# Patient Record
Sex: Female | Born: 1990 | Race: White | Hispanic: No
Health system: Southern US, Community
[De-identification: ages and names within clinical notes are randomized; demographics above are authoritative.]

---

## 2014-12-16 ENCOUNTER — Emergency Department: Payer: Self-pay | Admitting: Emergency Medicine

## 2015-10-04 IMAGING — CR DG SHOULDER 3+V*R*
1 series · 3 of 3 positions shown · non-contrast
Comparison: None.

CLINICAL DATA: A metal object fell from ceiling and hit patient in
right shoulder. Decreased range of motion, with right superior
shoulder pain. Initial encounter.

EXAM:
DG SHOULDER 3+ VIEWS RIGHT

[Series 1: dxr shoulder right complete · 0.14mm/px · 3 of 3 slices shown]
[im 1/3]
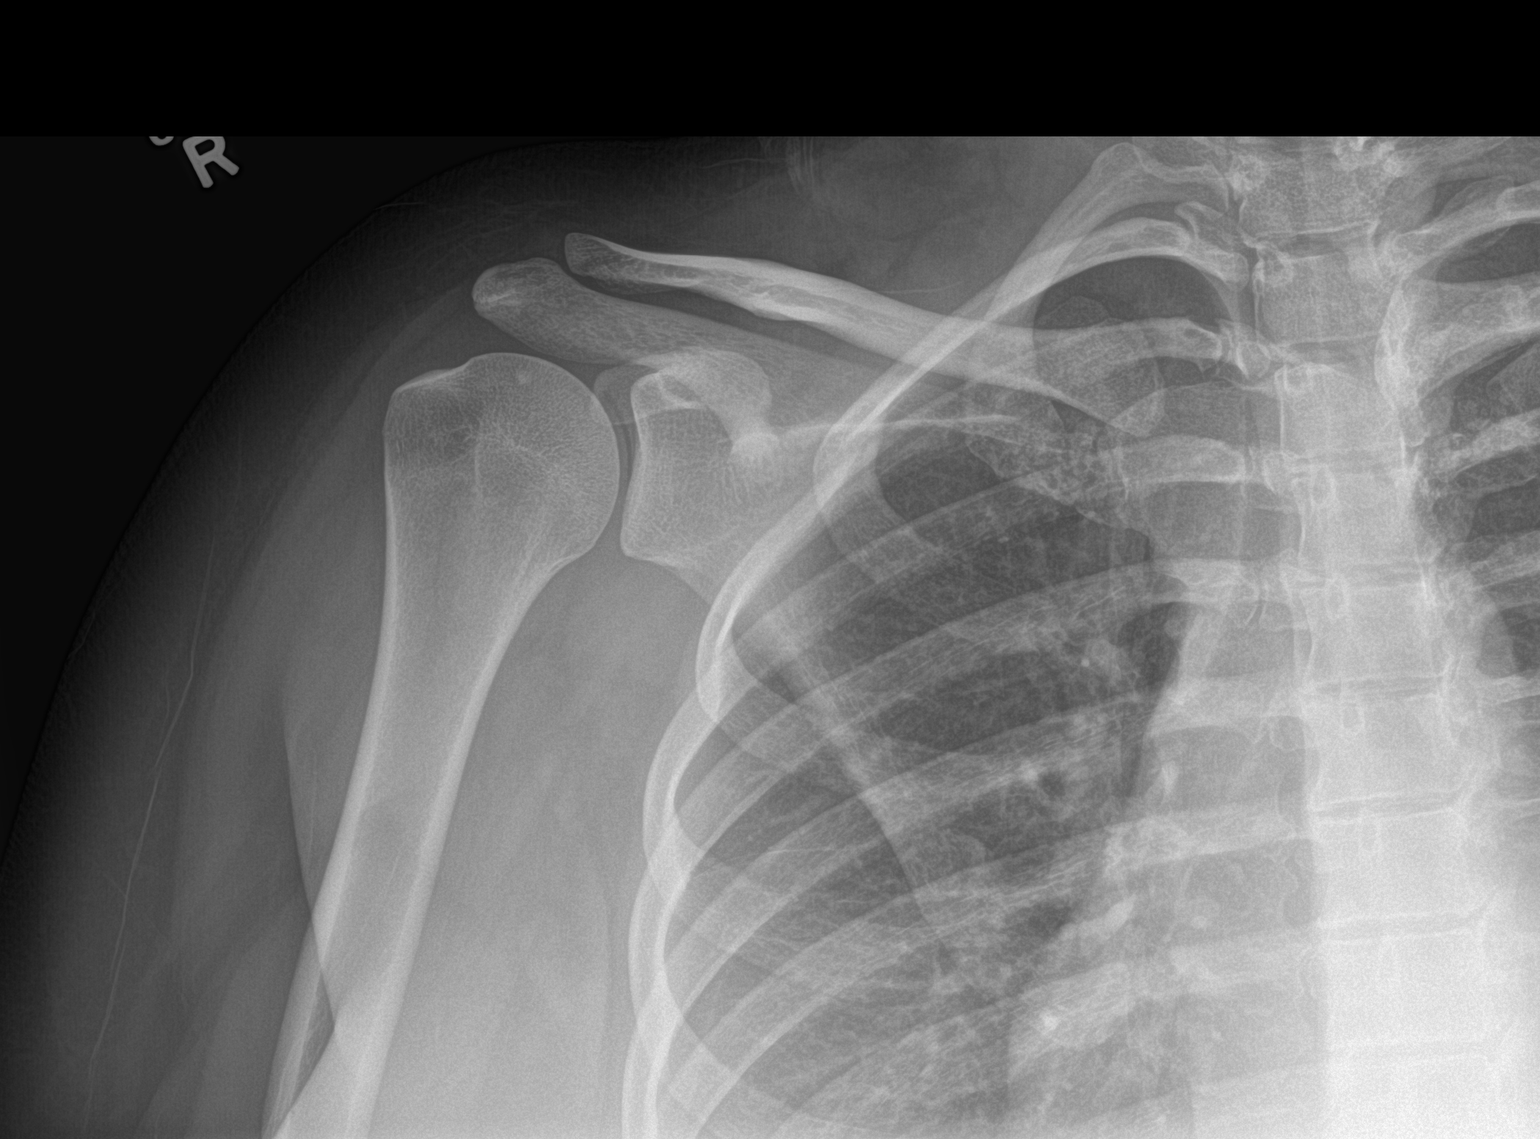
[im 2/3]
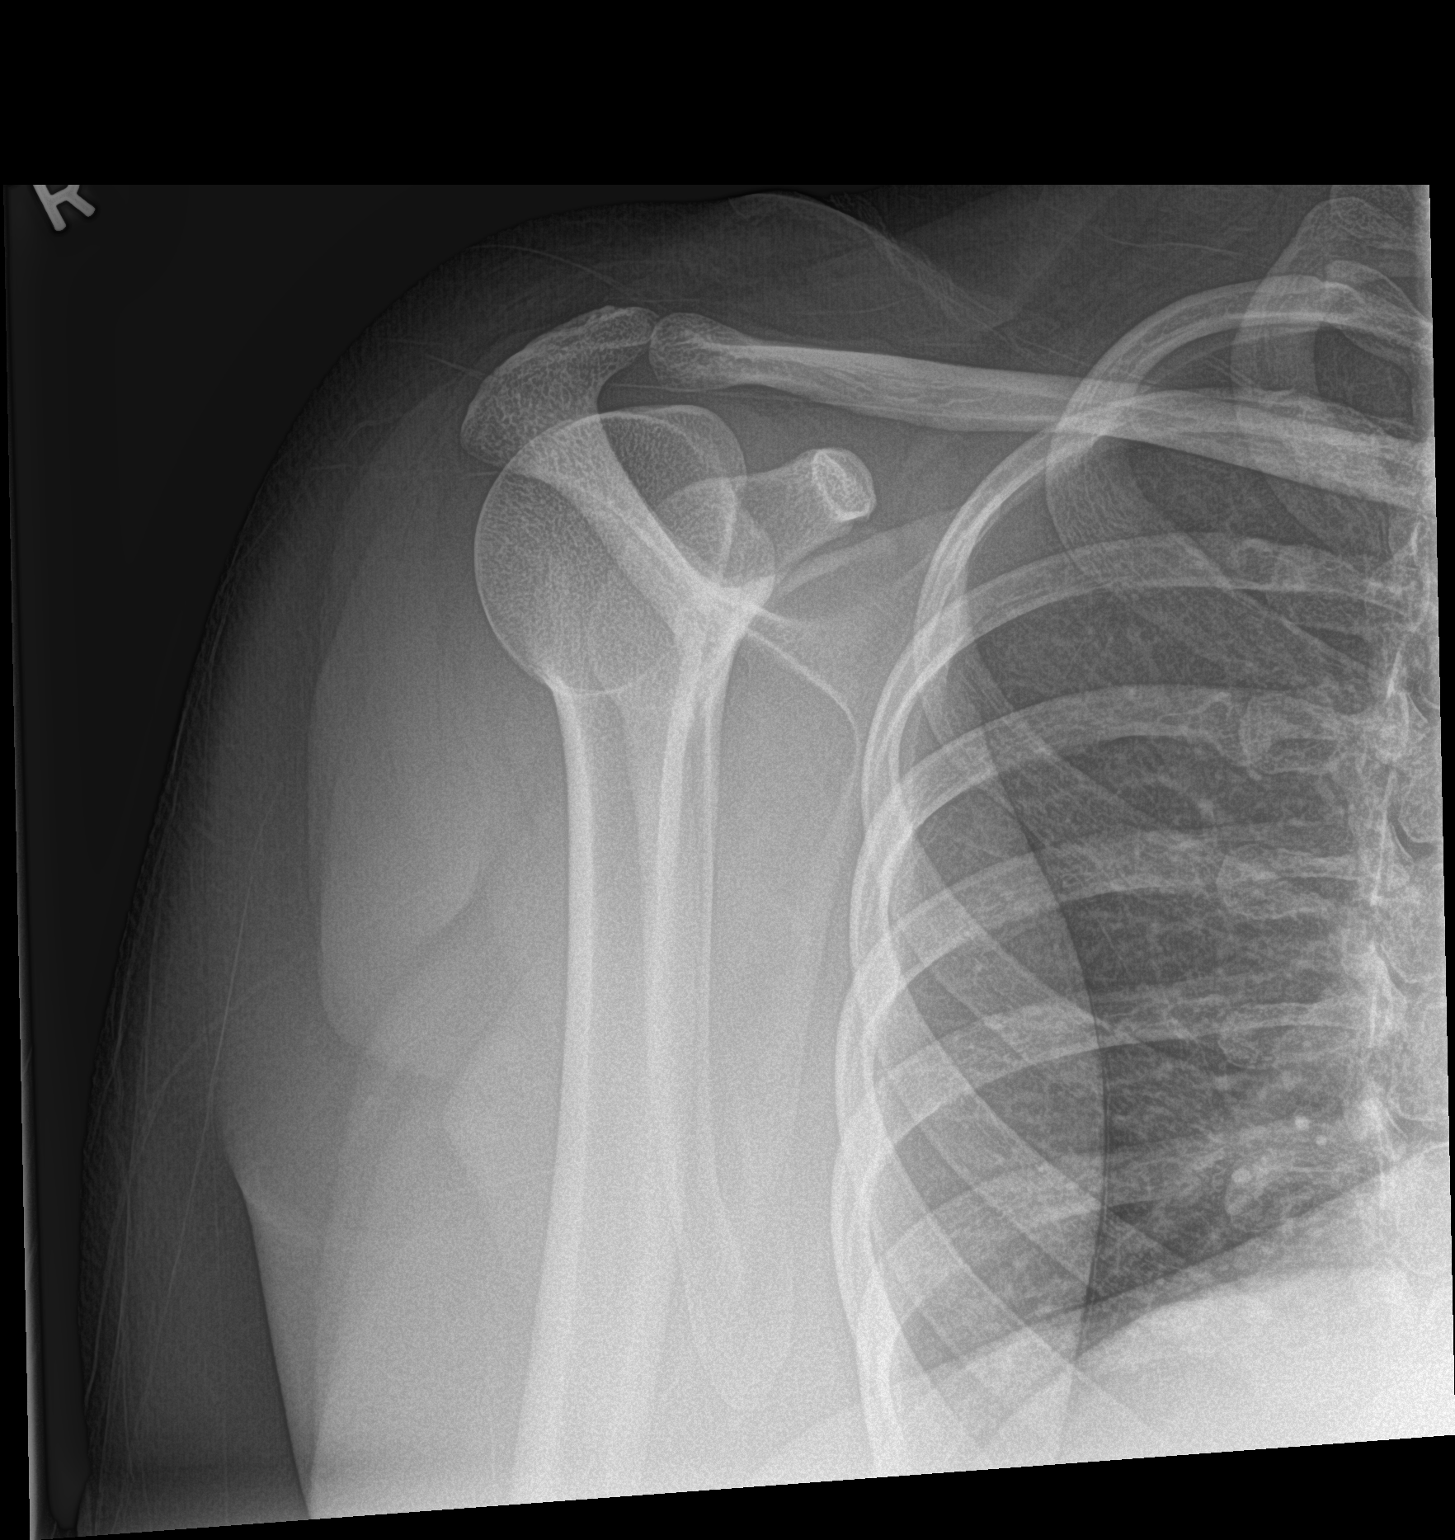
[im 3/3]
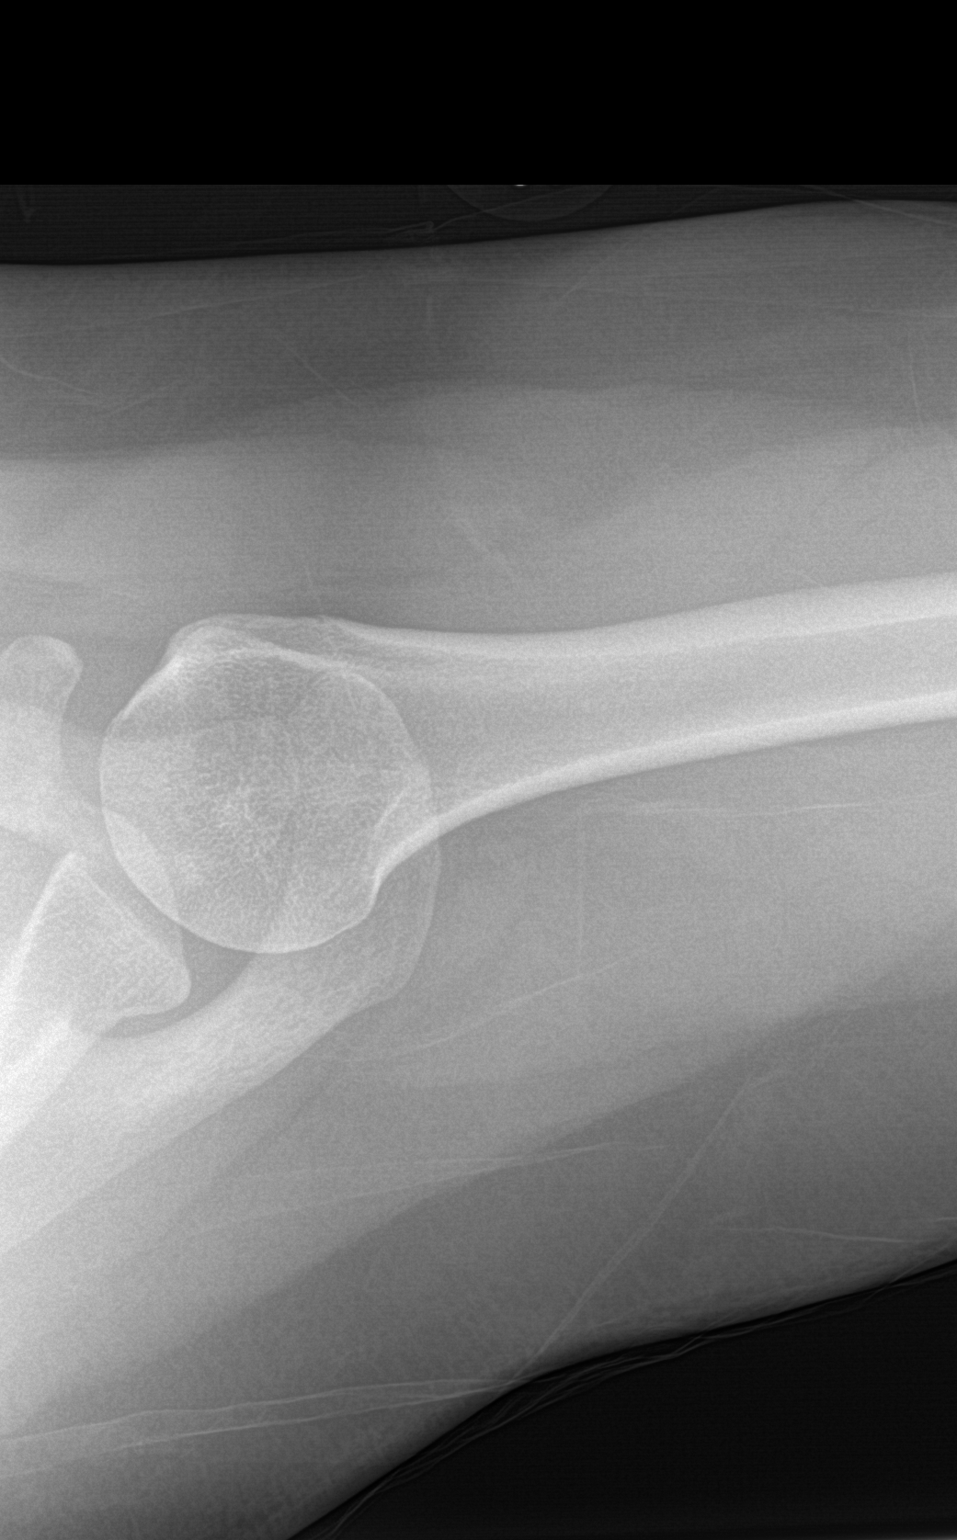

[3 of 3 positions shown; findings below may reference images not displayed]

FINDINGS: There is no evidence of fracture or dislocation. The right humeral
head is seated within the glenoid fossa. The acromioclavicular joint
is unremarkable in appearance. No significant soft tissue
abnormalities are seen. The visualized portions of the lungs are
clear.
IMPRESSION: No evidence of fracture or dislocation.

## 2015-10-04 IMAGING — CT CT HEAD WITHOUT CONTRAST
2 of 5 series · 11 of 47 positions shown, 13 images · non-contrast
Comparison: None.

CLINICAL DATA: Blunt object hit patient head.  Laceration.

EXAM:
CT HEAD WITHOUT CONTRAST
CT CERVICAL SPINE WITHOUT CONTRAST
TECHNIQUE: Multidetector CT imaging of the head, cervical spine performed using
the standard protocol without intravenous contrast. Multiplanar CT
image reconstructions of the cervical spine and maxillofacial
structures were also generated.

[Series 7: cor bone · coronal · 0.23mm/px · 3 of 39 slices shown]
[im 13/39  brain]
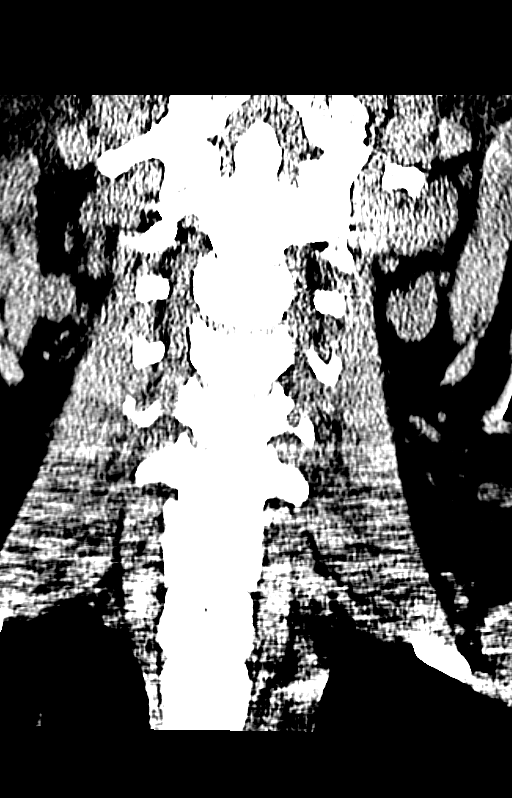
[im 17/39  brain]
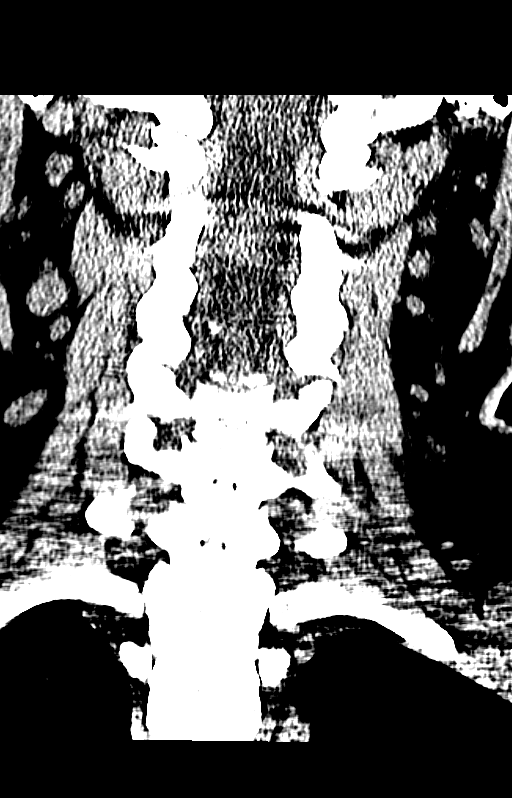
[im 22/39  brain]
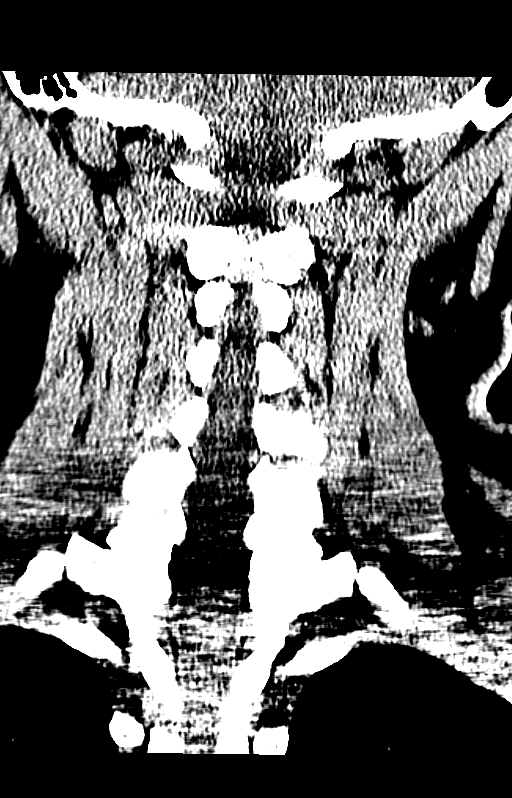

[Series 8: orthogonal axials · axial · 0.14mm/px · z∈[-297,-176]mm · 8 of 79 slices shown, 10 images]
[im 7/79  brain]
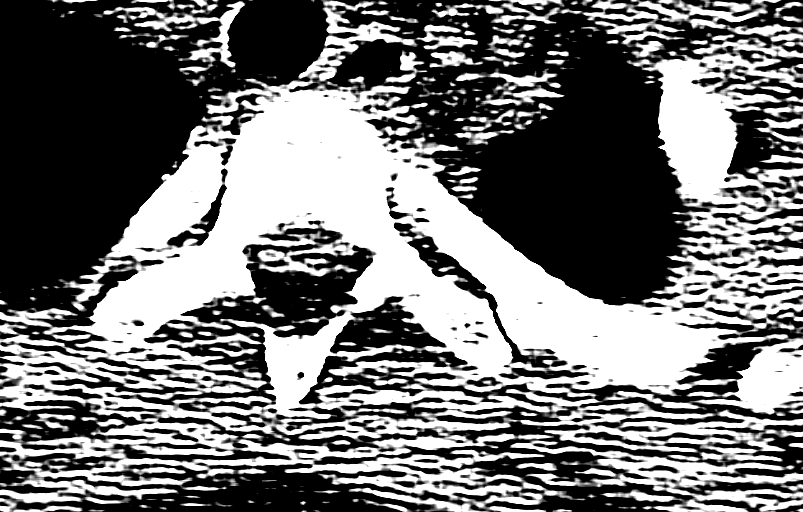
[im 7/79  bone]
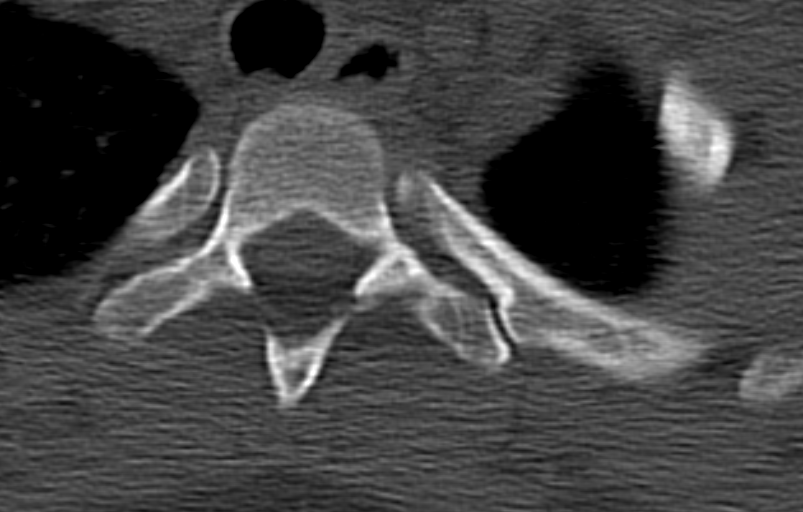
[im 20/79  brain]
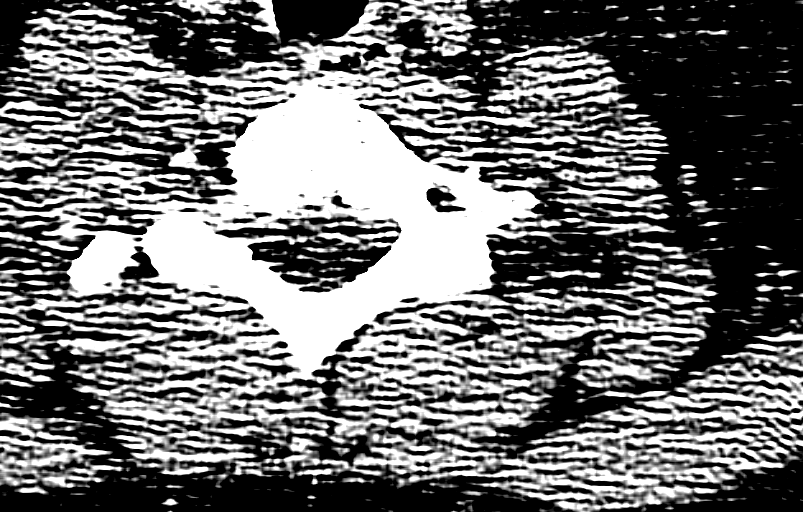
[im 27/79  brain]
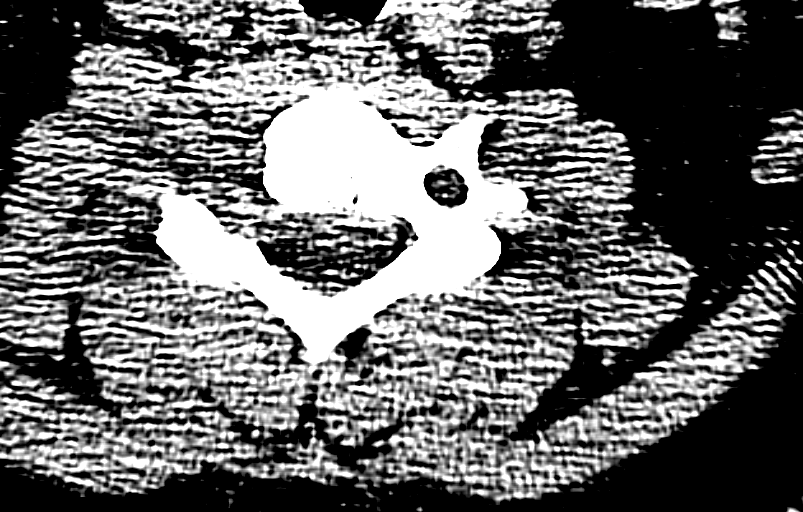
[im 33/79  brain]
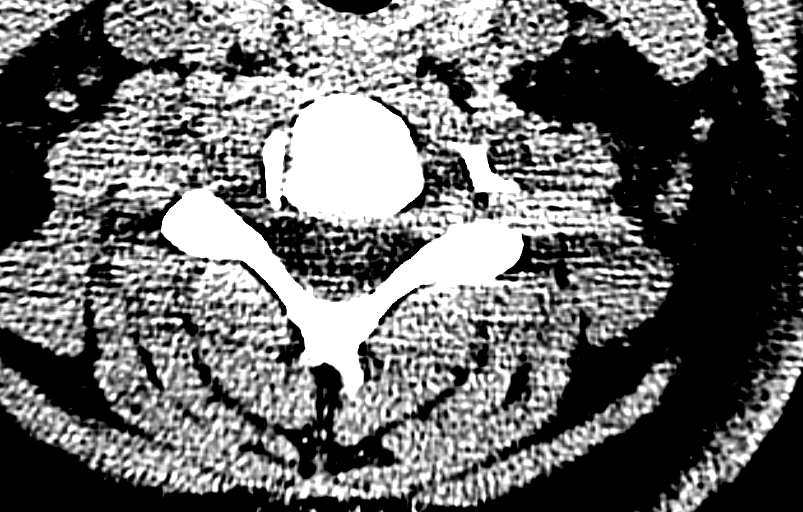
[im 46/79  brain]
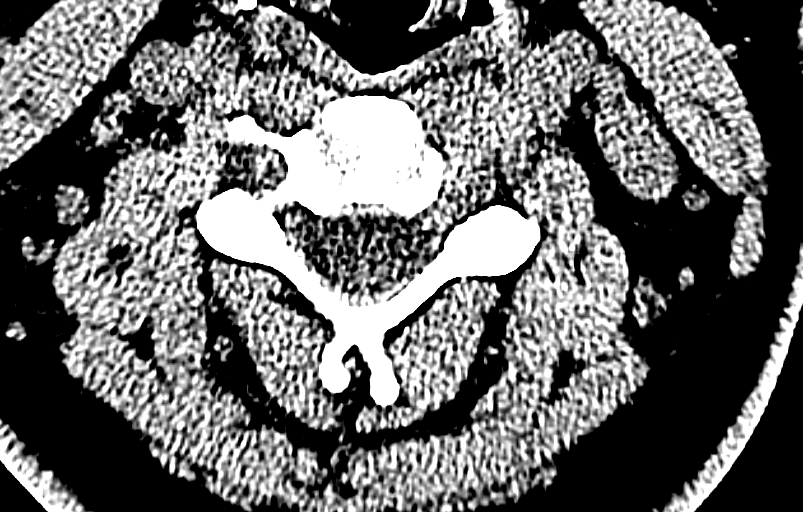
[im 46/79  bone]
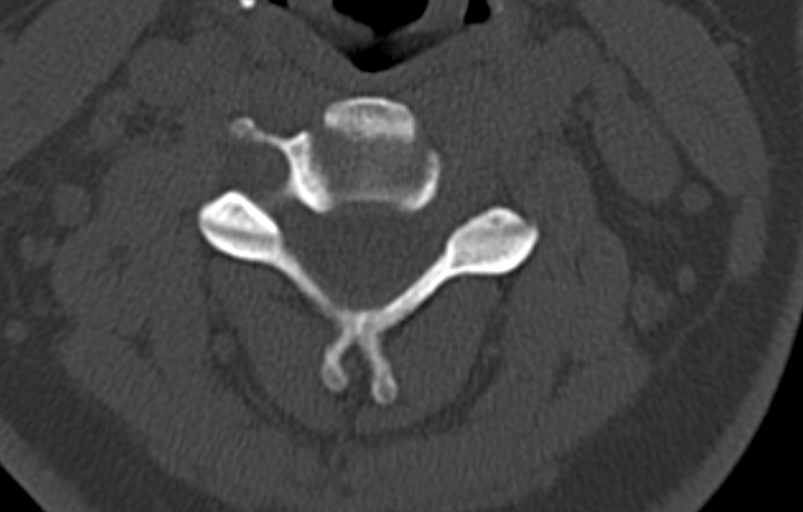
[im 53/79  brain]
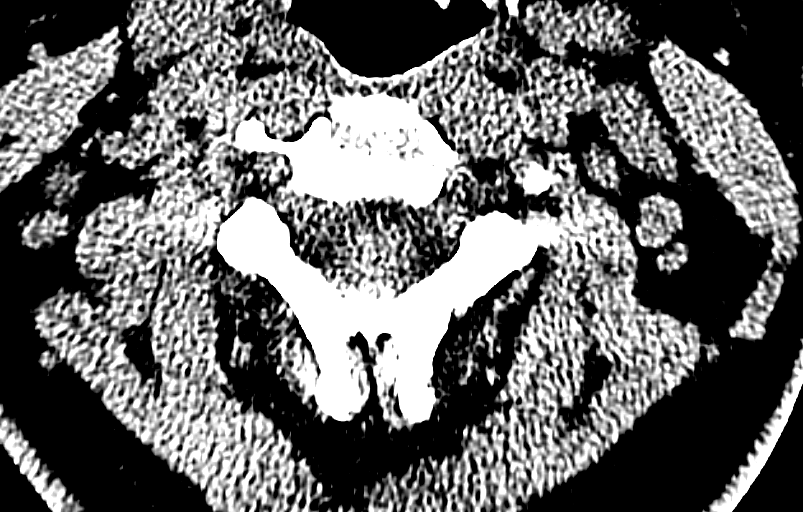
[im 59/79  brain]
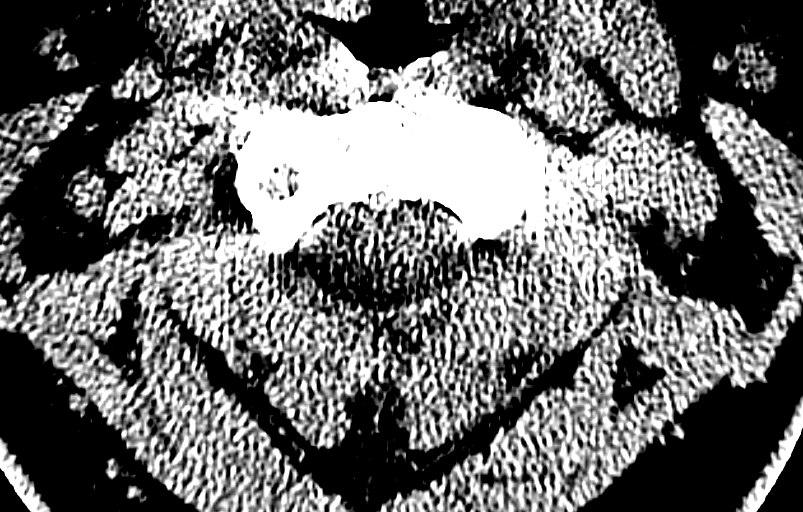
[im 72/79  brain]
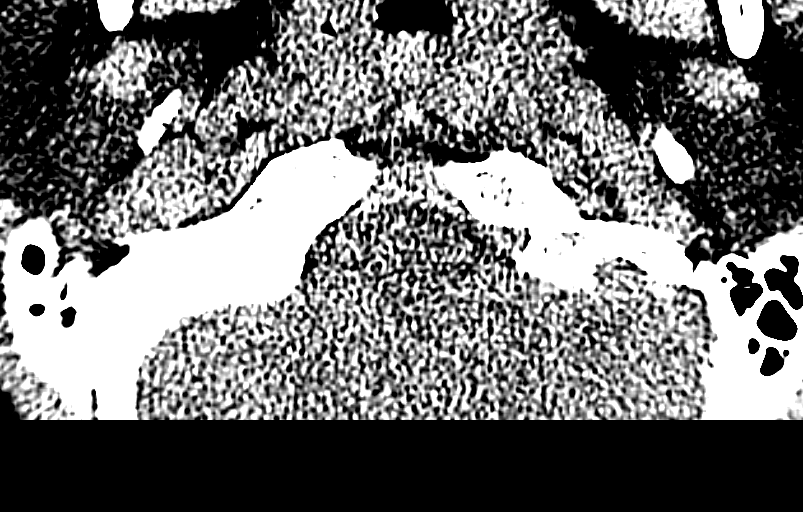

[11 of 47 positions shown; findings below may reference images not displayed]

FINDINGS: CT HEAD FINDINGS

No intracranial hemorrhage. No parenchymal contusion. No midline
shift or mass effect. Basilar cisterns are patent. No skull base
fracture. No fluid in the paranasal sinuses or mastoid air cells.
Orbits are normal. Small posterior scalp laceration. No skull
fracture.

CT CERVICAL SPINE FINDINGS

No prevertebral soft tissue swelling. Normal alignment of cervical
vertebral bodies. No loss of vertebral body height. Normal facet
articulation. Normal craniocervical junction.

No evidence epidural or paraspinal hematoma.
IMPRESSION: 1. No intracranial trauma.
2. Small scalp laceration.
3. No cervical spine fracture.

## 2020-08-07 ENCOUNTER — Other Ambulatory Visit: Payer: Self-pay | Admitting: Adult Health

## 2020-08-07 DIAGNOSIS — U071 COVID-19: Secondary | ICD-10-CM

## 2020-08-07 NOTE — Progress Notes (Signed)
I connected by phone with Kristen House on 08/07/2020 at 9:51 PM to discuss the potential use of a new treatment for mild to moderate COVID-19 viral infection in non-hospitalized patients.  This patient is a 29 y.o. female that meets the FDA criteria for Emergency Use Authorization of COVID monoclonal antibody casirivimab/imdevimab.  Has a (+) direct SARS-CoV-2 viral test result  Has mild or moderate COVID-19   Is NOT hospitalized due to COVID-19  Is within 10 days of symptom onset  Has at least one of the high risk factor(s) for progression to severe COVID-19 and/or hospitalization as defined in EUA.  Specific high risk criteria : BMI > 25   I have spoken and communicated the following to the patient or parent/caregiver regarding COVID monoclonal antibody treatment:  1. FDA has authorized the emergency use for the treatment of mild to moderate COVID-19 in adults and pediatric patients with positive results of direct SARS-CoV-2 viral testing who are 75 years of age and older weighing at least 40 kg, and who are at high risk for progressing to severe COVID-19 and/or hospitalization.  2. The significant known and potential risks and benefits of COVID monoclonal antibody, and the extent to which such potential risks and benefits are unknown.  3. Information on available alternative treatments and the risks and benefits of those alternatives, including clinical trials.  4. Patients treated with COVID monoclonal antibody should continue to self-isolate and use infection control measures (e.g., wear mask, isolate, social distance, avoid sharing personal items, clean and disinfect "high touch" surfaces, and frequent handwashing) according to CDC guidelines.   5. The patient or parent/caregiver has the option to accept or refuse COVID monoclonal antibody treatment.  After reviewing this information with the patient, The patient agreed to proceed with receiving casirivimab\imdevimab infusion and  will be provided a copy of the Fact sheet prior to receiving the infusion.  Set up for 9/24 at 1630  Will bring copy of test results.   Kristen House 08/07/2020 9:51 PM

## 2020-08-08 ENCOUNTER — Other Ambulatory Visit (HOSPITAL_COMMUNITY): Payer: Self-pay

## 2020-08-08 ENCOUNTER — Ambulatory Visit (HOSPITAL_COMMUNITY)
Admission: RE | Admit: 2020-08-08 | Discharge: 2020-08-08 | Disposition: A | Discharge status: Home / Self Care | Payer: Commercial Managed Care - PPO | Source: Ambulatory Visit | Attending: Pulmonary Disease | Admitting: Pulmonary Disease

## 2020-08-08 DIAGNOSIS — U071 COVID-19: Secondary | ICD-10-CM | POA: Insufficient documentation

## 2020-08-08 MED ORDER — FAMOTIDINE IN NACL 20-0.9 MG/50ML-% IV SOLN: 20.0000 mg | Freq: Once | INTRAVENOUS | Status: DC | PRN

## 2020-08-08 MED ORDER — METHYLPREDNISOLONE SODIUM SUCC 125 MG IJ SOLR: 125.0000 mg | Freq: Once | INTRAMUSCULAR | Status: DC | PRN

## 2020-08-08 MED ORDER — EPINEPHRINE 0.3 MG/0.3ML IJ SOAJ: 0.3000 mg | Freq: Once | INTRAMUSCULAR | Status: DC | PRN

## 2020-08-08 MED ORDER — ONDANSETRON HCL 4 MG/2ML IJ SOLN
4.0000 mg | Freq: Once | INTRAMUSCULAR | Status: AC
  Administered 2020-08-08: 4 mg via INTRAVENOUS
  Filled 2020-08-08: qty 2

## 2020-08-08 MED ORDER — SODIUM CHLORIDE 0.9 % IV SOLN
1200.0000 mg | Freq: Once | INTRAVENOUS | Status: AC
  Administered 2020-08-08: 1200 mg via INTRAVENOUS

## 2020-08-08 MED ORDER — DIPHENHYDRAMINE HCL 50 MG/ML IJ SOLN: 50.0000 mg | Freq: Once | INTRAMUSCULAR | Status: DC | PRN

## 2020-08-08 MED ORDER — SODIUM CHLORIDE 0.9 % IV SOLN: INTRAVENOUS | Status: DC | PRN

## 2020-08-08 MED ORDER — ALBUTEROL SULFATE HFA 108 (90 BASE) MCG/ACT IN AERS: 2.0000 | INHALATION_SPRAY | Freq: Once | RESPIRATORY_TRACT | Status: DC | PRN

## 2020-08-08 NOTE — Discharge Instructions (Signed)

## 2020-08-08 NOTE — Progress Notes (Signed)
  Diagnosis: COVID-19  Physician: Dr  Wright  Procedure: [facility] Med: casirivimab\imdevimab infusion - Provided patient with casirivimab\imdevimab fact sheet for patients, parents and caregivers prior to infusion.  Complications: No immediate complications noted.  Discharge: Discharged home   Aaniyah Strohm L 08/08/2020   

## 2022-05-21 ENCOUNTER — Other Ambulatory Visit: Payer: Self-pay | Admitting: Internal Medicine

## 2022-05-21 DIAGNOSIS — Z1231 Encounter for screening mammogram for malignant neoplasm of breast: Secondary | ICD-10-CM

## 2022-05-25 ENCOUNTER — Other Ambulatory Visit: Payer: Self-pay | Admitting: Internal Medicine

## 2022-05-25 DIAGNOSIS — N63 Unspecified lump in unspecified breast: Secondary | ICD-10-CM

## 2022-06-11 ENCOUNTER — Ambulatory Visit
Admission: RE | Admit: 2022-06-11 | Discharge: 2022-06-11 | Disposition: A | Discharge status: Home / Self Care | Payer: Commercial Managed Care - PPO | Source: Ambulatory Visit | Attending: Internal Medicine | Admitting: Internal Medicine

## 2022-06-11 DIAGNOSIS — N63 Unspecified lump in unspecified breast: Secondary | ICD-10-CM | POA: Insufficient documentation

## 2023-12-20 ENCOUNTER — Other Ambulatory Visit: Payer: Self-pay | Admitting: Obstetrics and Gynecology

## 2023-12-20 DIAGNOSIS — N6489 Other specified disorders of breast: Secondary | ICD-10-CM

## 2023-12-23 ENCOUNTER — Other Ambulatory Visit: Payer: Self-pay | Admitting: Obstetrics and Gynecology

## 2023-12-23 DIAGNOSIS — N6489 Other specified disorders of breast: Secondary | ICD-10-CM

## 2024-02-01 ENCOUNTER — Ambulatory Visit
Admission: RE | Admit: 2024-02-01 | Discharge: 2024-02-01 | Disposition: A | Discharge status: Home / Self Care | Payer: Commercial Managed Care - PPO | Source: Ambulatory Visit | Attending: Obstetrics and Gynecology | Admitting: Obstetrics and Gynecology

## 2024-02-01 DIAGNOSIS — N6489 Other specified disorders of breast: Secondary | ICD-10-CM
